# Patient Record
Sex: Male | Born: 2005 | Race: Asian | Hispanic: No | Marital: Single | Smoking: Never smoker
Health system: Southern US, Community
[De-identification: ages and names within clinical notes are randomized; demographics above are authoritative.]

## PROBLEM LIST (undated history)

## (undated) ENCOUNTER — Emergency Department (HOSPITAL_COMMUNITY): Payer: Medicaid Other

---
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
## 2024-02-03 ENCOUNTER — Other Ambulatory Visit (HOSPITAL_COMMUNITY): Payer: Self-pay

## 2024-06-10 ENCOUNTER — Other Ambulatory Visit (HOSPITAL_COMMUNITY): Payer: Self-pay | Admitting: Neurology

## 2024-06-10 DIAGNOSIS — D8689 Sarcoidosis of other sites: Secondary | ICD-10-CM

## 2024-06-19 ENCOUNTER — Ambulatory Visit (HOSPITAL_COMMUNITY): Payer: MEDICAID
